# Patient Record
Sex: Male | Born: 1940 | Race: White | Hispanic: No | Smoking: Former smoker
Health system: Southern US, Community
[De-identification: ages and names within clinical notes are randomized; demographics above are authoritative.]

---

## 2016-10-23 DIAGNOSIS — F17210 Nicotine dependence, cigarettes, uncomplicated: Secondary | ICD-10-CM | POA: Diagnosis not present

## 2016-10-23 DIAGNOSIS — H53149 Visual discomfort, unspecified: Secondary | ICD-10-CM | POA: Diagnosis not present

## 2016-10-23 DIAGNOSIS — Z880 Allergy status to penicillin: Secondary | ICD-10-CM | POA: Diagnosis not present

## 2016-10-23 DIAGNOSIS — R51 Headache: Secondary | ICD-10-CM | POA: Diagnosis not present

## 2016-10-23 DIAGNOSIS — M542 Cervicalgia: Secondary | ICD-10-CM | POA: Diagnosis not present

## 2016-10-23 DIAGNOSIS — J0100 Acute maxillary sinusitis, unspecified: Secondary | ICD-10-CM | POA: Diagnosis not present

## 2016-10-23 DIAGNOSIS — R5383 Other fatigue: Secondary | ICD-10-CM | POA: Diagnosis not present

## 2016-11-01 DIAGNOSIS — B9689 Other specified bacterial agents as the cause of diseases classified elsewhere: Secondary | ICD-10-CM | POA: Diagnosis not present

## 2016-11-01 DIAGNOSIS — J329 Chronic sinusitis, unspecified: Secondary | ICD-10-CM | POA: Diagnosis not present

## 2016-11-01 DIAGNOSIS — R21 Rash and other nonspecific skin eruption: Secondary | ICD-10-CM | POA: Diagnosis not present

## 2016-11-01 DIAGNOSIS — R51 Headache: Secondary | ICD-10-CM | POA: Diagnosis not present

## 2016-12-01 DIAGNOSIS — R030 Elevated blood-pressure reading, without diagnosis of hypertension: Secondary | ICD-10-CM | POA: Diagnosis not present

## 2016-12-01 DIAGNOSIS — R51 Headache: Secondary | ICD-10-CM | POA: Diagnosis not present

## 2016-12-01 DIAGNOSIS — R21 Rash and other nonspecific skin eruption: Secondary | ICD-10-CM | POA: Diagnosis not present

## 2016-12-01 DIAGNOSIS — E0590 Thyrotoxicosis, unspecified without thyrotoxic crisis or storm: Secondary | ICD-10-CM | POA: Diagnosis not present

## 2016-12-15 DIAGNOSIS — Z125 Encounter for screening for malignant neoplasm of prostate: Secondary | ICD-10-CM | POA: Diagnosis not present

## 2016-12-15 DIAGNOSIS — N529 Male erectile dysfunction, unspecified: Secondary | ICD-10-CM | POA: Diagnosis not present

## 2016-12-15 DIAGNOSIS — Z716 Tobacco abuse counseling: Secondary | ICD-10-CM | POA: Diagnosis not present

## 2016-12-15 DIAGNOSIS — R0602 Shortness of breath: Secondary | ICD-10-CM | POA: Diagnosis not present

## 2016-12-15 DIAGNOSIS — R030 Elevated blood-pressure reading, without diagnosis of hypertension: Secondary | ICD-10-CM | POA: Diagnosis not present

## 2016-12-15 DIAGNOSIS — Z0000 Encounter for general adult medical examination without abnormal findings: Secondary | ICD-10-CM | POA: Diagnosis not present

## 2016-12-15 DIAGNOSIS — Z79899 Other long term (current) drug therapy: Secondary | ICD-10-CM | POA: Diagnosis not present

## 2017-01-20 DIAGNOSIS — I8391 Asymptomatic varicose veins of right lower extremity: Secondary | ICD-10-CM | POA: Diagnosis not present

## 2017-01-20 DIAGNOSIS — L300 Nummular dermatitis: Secondary | ICD-10-CM | POA: Diagnosis not present

## 2017-02-03 DIAGNOSIS — L300 Nummular dermatitis: Secondary | ICD-10-CM | POA: Diagnosis not present

## 2017-02-03 DIAGNOSIS — L820 Inflamed seborrheic keratosis: Secondary | ICD-10-CM | POA: Diagnosis not present

## 2017-02-17 DIAGNOSIS — R0602 Shortness of breath: Secondary | ICD-10-CM | POA: Diagnosis not present

## 2017-02-17 DIAGNOSIS — F17210 Nicotine dependence, cigarettes, uncomplicated: Secondary | ICD-10-CM | POA: Diagnosis not present

## 2017-02-17 DIAGNOSIS — Z87891 Personal history of nicotine dependence: Secondary | ICD-10-CM | POA: Diagnosis not present

## 2017-02-17 DIAGNOSIS — R942 Abnormal results of pulmonary function studies: Secondary | ICD-10-CM | POA: Diagnosis not present

## 2017-03-10 DIAGNOSIS — L300 Nummular dermatitis: Secondary | ICD-10-CM | POA: Diagnosis not present

## 2017-03-10 DIAGNOSIS — L820 Inflamed seborrheic keratosis: Secondary | ICD-10-CM | POA: Diagnosis not present

## 2017-03-10 DIAGNOSIS — L821 Other seborrheic keratosis: Secondary | ICD-10-CM | POA: Diagnosis not present

## 2017-04-05 DIAGNOSIS — E038 Other specified hypothyroidism: Secondary | ICD-10-CM | POA: Diagnosis not present

## 2017-04-05 DIAGNOSIS — Z79899 Other long term (current) drug therapy: Secondary | ICD-10-CM | POA: Diagnosis not present

## 2017-04-07 DIAGNOSIS — M7130 Other bursal cyst, unspecified site: Secondary | ICD-10-CM | POA: Diagnosis not present

## 2017-04-07 DIAGNOSIS — L821 Other seborrheic keratosis: Secondary | ICD-10-CM | POA: Diagnosis not present

## 2017-04-07 DIAGNOSIS — L820 Inflamed seborrheic keratosis: Secondary | ICD-10-CM | POA: Diagnosis not present

## 2017-04-07 DIAGNOSIS — D489 Neoplasm of uncertain behavior, unspecified: Secondary | ICD-10-CM | POA: Diagnosis not present

## 2017-04-15 DIAGNOSIS — D3610 Benign neoplasm of peripheral nerves and autonomic nervous system, unspecified: Secondary | ICD-10-CM | POA: Diagnosis not present

## 2017-07-16 DIAGNOSIS — R21 Rash and other nonspecific skin eruption: Secondary | ICD-10-CM | POA: Diagnosis not present

## 2017-07-16 DIAGNOSIS — W57XXXA Bitten or stung by nonvenomous insect and other nonvenomous arthropods, initial encounter: Secondary | ICD-10-CM | POA: Diagnosis not present

## 2017-07-22 DIAGNOSIS — B359 Dermatophytosis, unspecified: Secondary | ICD-10-CM | POA: Diagnosis not present

## 2017-07-22 DIAGNOSIS — R030 Elevated blood-pressure reading, without diagnosis of hypertension: Secondary | ICD-10-CM | POA: Diagnosis not present

## 2017-07-22 DIAGNOSIS — R21 Rash and other nonspecific skin eruption: Secondary | ICD-10-CM | POA: Diagnosis not present

## 2017-07-29 DIAGNOSIS — L821 Other seborrheic keratosis: Secondary | ICD-10-CM | POA: Diagnosis not present

## 2017-07-29 DIAGNOSIS — B359 Dermatophytosis, unspecified: Secondary | ICD-10-CM | POA: Diagnosis not present

## 2017-07-29 DIAGNOSIS — D3610 Benign neoplasm of peripheral nerves and autonomic nervous system, unspecified: Secondary | ICD-10-CM | POA: Diagnosis not present

## 2017-08-17 DIAGNOSIS — L299 Pruritus, unspecified: Secondary | ICD-10-CM | POA: Diagnosis not present

## 2017-08-17 DIAGNOSIS — L309 Dermatitis, unspecified: Secondary | ICD-10-CM | POA: Diagnosis not present

## 2017-08-19 DIAGNOSIS — E559 Vitamin D deficiency, unspecified: Secondary | ICD-10-CM | POA: Diagnosis not present

## 2017-08-19 DIAGNOSIS — Z79899 Other long term (current) drug therapy: Secondary | ICD-10-CM | POA: Diagnosis not present

## 2017-08-19 DIAGNOSIS — E038 Other specified hypothyroidism: Secondary | ICD-10-CM | POA: Diagnosis not present

## 2017-08-19 DIAGNOSIS — E7800 Pure hypercholesterolemia, unspecified: Secondary | ICD-10-CM | POA: Diagnosis not present

## 2017-09-06 DIAGNOSIS — Z1211 Encounter for screening for malignant neoplasm of colon: Secondary | ICD-10-CM | POA: Diagnosis not present

## 2017-09-06 DIAGNOSIS — E039 Hypothyroidism, unspecified: Secondary | ICD-10-CM | POA: Diagnosis not present

## 2017-09-06 DIAGNOSIS — Z6826 Body mass index (BMI) 26.0-26.9, adult: Secondary | ICD-10-CM | POA: Diagnosis not present

## 2017-09-09 DIAGNOSIS — L309 Dermatitis, unspecified: Secondary | ICD-10-CM | POA: Diagnosis not present

## 2017-09-19 DIAGNOSIS — Z1212 Encounter for screening for malignant neoplasm of rectum: Secondary | ICD-10-CM | POA: Diagnosis not present

## 2017-09-19 DIAGNOSIS — Z1211 Encounter for screening for malignant neoplasm of colon: Secondary | ICD-10-CM | POA: Diagnosis not present

## 2017-12-20 DIAGNOSIS — E559 Vitamin D deficiency, unspecified: Secondary | ICD-10-CM | POA: Diagnosis not present

## 2017-12-20 DIAGNOSIS — Z0000 Encounter for general adult medical examination without abnormal findings: Secondary | ICD-10-CM | POA: Diagnosis not present

## 2017-12-20 DIAGNOSIS — Z125 Encounter for screening for malignant neoplasm of prostate: Secondary | ICD-10-CM | POA: Diagnosis not present

## 2017-12-20 DIAGNOSIS — Z131 Encounter for screening for diabetes mellitus: Secondary | ICD-10-CM | POA: Diagnosis not present

## 2017-12-20 DIAGNOSIS — R5383 Other fatigue: Secondary | ICD-10-CM | POA: Diagnosis not present

## 2017-12-20 DIAGNOSIS — Z13220 Encounter for screening for lipoid disorders: Secondary | ICD-10-CM | POA: Diagnosis not present

## 2017-12-20 DIAGNOSIS — Z114 Encounter for screening for human immunodeficiency virus [HIV]: Secondary | ICD-10-CM | POA: Diagnosis not present

## 2017-12-20 DIAGNOSIS — E291 Testicular hypofunction: Secondary | ICD-10-CM | POA: Diagnosis not present

## 2017-12-20 DIAGNOSIS — E538 Deficiency of other specified B group vitamins: Secondary | ICD-10-CM | POA: Diagnosis not present

## 2017-12-20 DIAGNOSIS — R0602 Shortness of breath: Secondary | ICD-10-CM | POA: Diagnosis not present

## 2018-06-04 DIAGNOSIS — R1084 Generalized abdominal pain: Secondary | ICD-10-CM | POA: Diagnosis not present

## 2018-06-04 DIAGNOSIS — B999 Unspecified infectious disease: Secondary | ICD-10-CM | POA: Diagnosis not present

## 2018-06-04 DIAGNOSIS — R1011 Right upper quadrant pain: Secondary | ICD-10-CM | POA: Diagnosis not present

## 2018-06-06 DIAGNOSIS — R1011 Right upper quadrant pain: Secondary | ICD-10-CM | POA: Diagnosis not present

## 2018-06-21 DIAGNOSIS — R5383 Other fatigue: Secondary | ICD-10-CM | POA: Diagnosis not present

## 2018-06-21 DIAGNOSIS — E7800 Pure hypercholesterolemia, unspecified: Secondary | ICD-10-CM | POA: Diagnosis not present

## 2018-06-21 DIAGNOSIS — E291 Testicular hypofunction: Secondary | ICD-10-CM | POA: Diagnosis not present

## 2018-06-21 DIAGNOSIS — Z79899 Other long term (current) drug therapy: Secondary | ICD-10-CM | POA: Diagnosis not present

## 2018-06-21 DIAGNOSIS — E538 Deficiency of other specified B group vitamins: Secondary | ICD-10-CM | POA: Diagnosis not present

## 2018-06-21 DIAGNOSIS — E559 Vitamin D deficiency, unspecified: Secondary | ICD-10-CM | POA: Diagnosis not present

## 2018-08-15 DIAGNOSIS — R05 Cough: Secondary | ICD-10-CM | POA: Diagnosis not present

## 2018-08-15 DIAGNOSIS — R531 Weakness: Secondary | ICD-10-CM | POA: Diagnosis not present

## 2018-08-15 DIAGNOSIS — R0981 Nasal congestion: Secondary | ICD-10-CM | POA: Diagnosis not present

## 2018-08-15 DIAGNOSIS — R9431 Abnormal electrocardiogram [ECG] [EKG]: Secondary | ICD-10-CM | POA: Diagnosis not present

## 2018-08-15 DIAGNOSIS — R0602 Shortness of breath: Secondary | ICD-10-CM | POA: Diagnosis not present

## 2018-08-15 DIAGNOSIS — R410 Disorientation, unspecified: Secondary | ICD-10-CM | POA: Diagnosis not present

## 2018-08-15 DIAGNOSIS — Z87891 Personal history of nicotine dependence: Secondary | ICD-10-CM | POA: Diagnosis not present

## 2018-08-15 DIAGNOSIS — J441 Chronic obstructive pulmonary disease with (acute) exacerbation: Secondary | ICD-10-CM | POA: Diagnosis not present

## 2018-08-15 DIAGNOSIS — R918 Other nonspecific abnormal finding of lung field: Secondary | ICD-10-CM | POA: Diagnosis not present

## 2018-08-16 DIAGNOSIS — R9431 Abnormal electrocardiogram [ECG] [EKG]: Secondary | ICD-10-CM | POA: Diagnosis not present

## 2018-09-22 ENCOUNTER — Ambulatory Visit (INDEPENDENT_AMBULATORY_CARE_PROVIDER_SITE_OTHER): Payer: PPO | Admitting: Family Medicine

## 2018-09-22 ENCOUNTER — Encounter: Payer: Self-pay | Admitting: Family Medicine

## 2018-09-22 ENCOUNTER — Other Ambulatory Visit: Payer: Self-pay

## 2018-09-22 VITALS — BP 130/62 | HR 69 | Temp 97.7°F | Ht 69.0 in | Wt 162.0 lb

## 2018-09-22 DIAGNOSIS — E039 Hypothyroidism, unspecified: Secondary | ICD-10-CM | POA: Diagnosis not present

## 2018-09-22 DIAGNOSIS — R479 Unspecified speech disturbances: Secondary | ICD-10-CM | POA: Diagnosis not present

## 2018-09-22 DIAGNOSIS — Z122 Encounter for screening for malignant neoplasm of respiratory organs: Secondary | ICD-10-CM

## 2018-09-22 DIAGNOSIS — R499 Unspecified voice and resonance disorder: Secondary | ICD-10-CM

## 2018-09-22 DIAGNOSIS — R0609 Other forms of dyspnea: Secondary | ICD-10-CM | POA: Diagnosis not present

## 2018-09-22 DIAGNOSIS — J449 Chronic obstructive pulmonary disease, unspecified: Secondary | ICD-10-CM | POA: Diagnosis not present

## 2018-09-22 MED ORDER — ALBUTEROL SULFATE HFA 108 (90 BASE) MCG/ACT IN AERS: 2.0000 | INHALATION_SPRAY | RESPIRATORY_TRACT | 5 refills | Status: AC | PRN

## 2018-09-22 NOTE — Progress Notes (Signed)
[facility] Phone: [phone]     Scott Luna - 78 y.o. male MRN [id number]  Date of birth: 1940/11/18  Subjective:   cc: new patient, voice problem  HPI:    Voice problem - new issue.  Having trouble talking, but states today is a 'good day'.  He feels as if he doesn't 'have enough air' to anunciate.  No throat pain.  It started three weeks ago. No trigger or inciting incident.  He has a chronic cough from smoking. He has daily phlegm.  No wheezing.  He started smoking at age 64 and quit two years ago.  He smoked 1.5-2 ppd.  He went to a pulmonologist on [place] 3 to 6 months ago and was given an inhaler.  They took chest x rays but he hasn't been able to get his records from them bc the medical records department is 'shut down'.   The medical records department is 'shut down'. His son, an EMT, also gave him a small inhaler.  Uncertain of the type of inhaler, whether it is every day or prn.  He uses it once a week.  If he walks for a long period of time his legs feel weak. No dififculty swallowing, just 'soft and scratchy' voice.    He was in the hospital 6 months ago and then 3 months ago for SOB, and  'trouble getting enough air'    Hypothyroid: He takes medication for thyroid on a daily basis.  He had radiation therapy 20 years ago for hyperthyroid, now takes 59mcg of synthroid.    Not taking any other medications.  Takes vitamin B3 and B12.    No allergies.    SH: minmial alcohol use 'beer every now and then'. No drug use.  Widowed 9 years ago.  Boy and girl.  Working at [employer].     PMH: no family hx of throat disease.  No family or personal hx of heart diseae.   ROS: See HPI for pertinent positives and negatives  Past Medical History  Family history reviewed for today's visit. No changes.  Social history- patient is a former smoker  Objective:   BP 130/62   Pulse 69   Temp 97.7 F (36.5 C) (Oral)   Ht 5\' 9"  (1.753 m)   Wt 162 lb  (73.5 kg)   SpO2 96%   BMI 23.92 kg/m  Gen: NAD, alert and oriented, cooperative with exam HEENT: NCAT, EOMI, MMM Neck: FROM, supple, no masses CV: normal rate, regular rhythm. No murmurs, no rubs.  Resp: LCTAB, no wheezes, crackles. normal work of breathing GI: nontender to palpation, BS present, no guarding or organomegaly Msk: No edema, warm, normal tone, moves UE/LE spontaneously Neuro: CN II-XII grossly intact. no gross deficits Skin: No rashes, no lesions Psych: Appropriate behavior  Assessment/Plan:   No problem-specific Assessment & Plan notes found for this encounter.   Orders Placed This Encounter  Procedures  . CT CHEST LUNG CA SCREEN LOW DOSE W/O CM    Standing Status:   Future    Standing Expiration Date:   11/22/2019    Order Specific Question:   Reason for Exam (SYMPTOM  OR DIAGNOSIS REQUIRED)    Answer:   lung cancer screen. former smoker. 30+ pack years    Order Specific Question:   Preferred Imaging Location?    Answer:   [facility]    Order Specific Question:   Radiology Contrast Protocol - do NOT remove  file path    Answer:   [email or web address]  . CBC  . Basic Metabolic Panel  . TSH  . Lipid Panel  . Ambulatory referral to ENT    Referral Priority:   Routine    Referral Type:   Consultation    Referral Reason:   Specialty Services Required    Requested Specialty:   Otolaryngology    Number of Visits Requested:   1    Meds ordered this encounter  Medications  . albuterol (VENTOLIN HFA) 108 (90 Base) MCG/ACT inhaler    Sig: Inhale 2 puffs into the lungs every 4 (four) hours as needed for wheezing or shortness of breath.    Dispense:  18 g    Refill:  Aguilar, MD PGY-2 [facility]

## 2018-09-22 NOTE — Patient Instructions (Addendum)
I have prescribed you an albuterol rescue inhaler which you can use as needed for shortness of breath or wheezing.  I will let you know the results of the lab work when I get it.  I have put in a referral for ear nose and throat, this will take a while to get seen by them.  It is important you get the CT scan before you see them.  Have a great day,  Clemetine Marker, MD

## 2018-09-23 LAB — BASIC METABOLIC PANEL
BUN/Creatinine Ratio: 16 (ref 10–24)
BUN: 19 mg/dL (ref 8–27)
CO2: 22 mmol/L (ref 20–29)
Calcium: 9.5 mg/dL (ref 8.6–10.2)
Chloride: 102 mmol/L (ref 96–106)
Creatinine, Ser: 1.19 mg/dL (ref 0.76–1.27)
GFR calc Af Amer: 68 mL/min/{1.73_m2} (ref >59)
GFR calc non Af Amer: 59 mL/min/{1.73_m2} — ABNORMAL LOW (ref >59)
Glucose: 83 mg/dL (ref 65–99)
Potassium: 4.6 mmol/L (ref 3.5–5.2)
Sodium: 139 mmol/L (ref 134–144)

## 2018-09-23 LAB — CBC
Hematocrit: 42.9 % (ref 37.5–51.0)
Hemoglobin: 14.2 g/dL (ref 13.0–17.7)
MCH: 30.6 pg (ref 26.6–33.0)
MCHC: 33.1 g/dL (ref 31.5–35.7)
MCV: 93 fL (ref 79–97)
Platelets: 298 10*3/uL (ref 150–450)
RBC: 4.64 x10E6/uL (ref 4.14–5.80)
RDW: 13.1 % (ref 11.6–15.4)
WBC: 5.4 10*3/uL (ref 3.4–10.8)

## 2018-09-23 LAB — LIPID PANEL
Chol/HDL Ratio: 4.4 ratio (ref 0.0–5.0)
Cholesterol, Total: 214 mg/dL — ABNORMAL HIGH (ref 100–199)
HDL: 49 mg/dL (ref >39)
LDL Chol Calc (NIH): 142 mg/dL — ABNORMAL HIGH (ref 0–99)
Triglycerides: 126 mg/dL (ref 0–149)
VLDL Cholesterol Cal: 23 mg/dL (ref 5–40)

## 2018-09-23 LAB — TSH: TSH: 2.05 u[IU]/mL (ref 0.450–4.500)

## 2018-09-26 DIAGNOSIS — E039 Hypothyroidism, unspecified: Secondary | ICD-10-CM | POA: Insufficient documentation

## 2018-09-26 DIAGNOSIS — R499 Unspecified voice and resonance disorder: Secondary | ICD-10-CM | POA: Insufficient documentation

## 2018-09-26 DIAGNOSIS — J449 Chronic obstructive pulmonary disease, unspecified: Secondary | ICD-10-CM | POA: Insufficient documentation

## 2018-09-26 NOTE — Assessment & Plan Note (Signed)
Complaint of not having the air capacity to speak like he normally does. Has been going on for three weeks. Long time former smoker with over 50 pack year history.  Also having decreased exercise capacity.  He does not believe his voice problem is cause by his lungs, but is more 'in his throat'.  On exam he has crackles on the right side. Oropharynx appears normal.  Voice does not appear soft or hoarse, though he said today was an improvement from other days.   - BMP, CBC,  - lung cancer screening CT - referral to ENT

## 2018-09-26 NOTE — Assessment & Plan Note (Addendum)
Chronic condition.  Currently on 88 mcg synthroid.  No refill needed today.   - TSH

## 2018-09-26 NOTE — Assessment & Plan Note (Signed)
Has recently seen two pulmonologists (did not get along with first one).  States he had PFTs performed and was given an inhaler.  Do not have those results and uncertain what inhaler he has, though likely albuterol.   - albuterol inhaler - request records from pulmonologist.

## 2018-09-27 ENCOUNTER — Ambulatory Visit (HOSPITAL_BASED_OUTPATIENT_CLINIC_OR_DEPARTMENT_OTHER)
Admission: RE | Admit: 2018-09-27 | Discharge: 2018-09-27 | Disposition: A | Discharge status: Home / Self Care | Payer: PPO | Source: Ambulatory Visit | Attending: Family Medicine | Admitting: Family Medicine

## 2018-09-27 ENCOUNTER — Other Ambulatory Visit: Payer: Self-pay

## 2018-09-27 DIAGNOSIS — Z87891 Personal history of nicotine dependence: Secondary | ICD-10-CM | POA: Diagnosis not present

## 2018-09-27 DIAGNOSIS — J439 Emphysema, unspecified: Secondary | ICD-10-CM | POA: Diagnosis not present

## 2018-09-27 DIAGNOSIS — Z122 Encounter for screening for malignant neoplasm of respiratory organs: Secondary | ICD-10-CM | POA: Insufficient documentation

## 2018-09-28 ENCOUNTER — Encounter: Payer: Self-pay | Admitting: Family Medicine

## 2018-10-20 ENCOUNTER — Other Ambulatory Visit: Payer: Self-pay

## 2018-10-20 ENCOUNTER — Ambulatory Visit (INDEPENDENT_AMBULATORY_CARE_PROVIDER_SITE_OTHER): Payer: PPO | Admitting: Family Medicine

## 2018-10-20 ENCOUNTER — Encounter: Payer: Self-pay | Admitting: Family Medicine

## 2018-10-20 VITALS — BP 104/60 | HR 65 | Wt 165.2 lb

## 2018-10-20 DIAGNOSIS — J449 Chronic obstructive pulmonary disease, unspecified: Secondary | ICD-10-CM

## 2018-10-20 DIAGNOSIS — R413 Other amnesia: Secondary | ICD-10-CM

## 2018-10-20 NOTE — Patient Instructions (Signed)
I would like to see you back in 2-3 weeks for further evaluation of your memory concerns.  I will discuss with you your results from the labs we ordered at that time.    I will get the records from the pulmonologist too, which we can discuss at that appointment.    Have a great day,   Clemetine Marker, MD.

## 2018-10-20 NOTE — Assessment & Plan Note (Signed)
Patient notes improvement with dyspnea since previous visit. Received prescription for albuterol inhaler on 9/10 but has not used medication. Chest CT on 9/15 revealed a benign nodule and moderate emphysematous changes. States that he was recently seen by a pulmonologist and had PFTs performed.  - Will request records from pulmonologist and review with patient at next visit in 2-3 weeks

## 2018-10-20 NOTE — Assessment & Plan Note (Addendum)
Patient reports difficulty focusing and memory changes for the past 5 weeks. Past medical history significant for hypothyroidism. PE unremarkable, PHQ score: 5. Mild cognitive impairment is a concern given age and presentation. Dementia is less likely given patient is able to perform activities of daily living. Pseudodementia (secondary to depression) also considered but less likely given normal PHQ-9. Hypothyroidism and B12 deficiency could also contribute to memory changes and must be ruled out.  - Patient advised to return in 2-3 weeks to receive MOCA and further evaluate of mild cognitive impairment/dementia.  - Thyroid function test and B12 labs ordered. Will review findings.

## 2018-10-20 NOTE — Progress Notes (Signed)
Subjective  CC: Memory changes and COPD  Scott Luna is a 78 y.o. male with a past medical history of hypothyroidism and COPD who presents today with the following problems:  MEMORY CHANGES:  Patient states that his brain feels "slower" and he has had difficulty focusing and remembering words for the past 5 weeks. He also notes changes in his handwriting, which he says is smaller than normal. Reports several episodes of forgetfulness while performing daily activities but is able to refocus himself eventually. He states that symptoms have gradually improved on their own since onset. Denies headaches, muscle spasms, vision changes, night sweats, loss of coordination/balance. No syncopal episodes. He has noted a 10 lb weight loss over the past 4 months, but states that this is likely associated with a change in his diet.   COPD Patient was last seen on 09/22/18 for a voice problem, which he felt was related to being short of breath. He reports improvement with dyspnea since that time and is able to walk and carry out daily functions without feeling significantly short of breath. Patient still reports issues with speaking but now believes this is associated with memory issues. Patient received chest CT on 9/15 that revealed a benign nodule and moderate emphysematous changes but was otherwise normal. He was prescribed an albuterol inhaler on 9/10 but has not picked up the medication. States that he does not think he needs the inhaler. He states that he was recently seen by a pulmonologist [place], who performed PFTs and prescribed patient with an inhaler. Discussed receiving records from the pulmonologist.  Reports congestion and chronic cough (productive of yellow sputum) since he quit smoking 2 years ago. Denies chest pain, blood in sputum.   Pertinent P/F/SHx:  - Hypothyroidism treated with Synthroid - COPD - Former smoker: began at age of 12 (1.5-2 ppd), quit 2 years ago   ROS: Pertinent  ROS included in HPI. Objective  Physical Exam:  BP 104/60   Pulse 65   Wt 165 lb 3.2 oz (74.9 kg)   SpO2 97%   BMI 24.40 kg/m   Gen: NAD, resting comfortably CV: RRR with no murmurs appreciated Pulm: NWOB, mild right-sided wheezing  MSK: no edema, cyanosis, or clubbing noted Skin: warm, dry Neuro: grossly normal, CN II-VII intact, negative romberg, normal gait, negative finger to nose, moves all extremities Psych: Normal affect and thought content  Depression screen [facility] 2/9 10/20/2018  Decreased Interest 1  Down, Depressed, Hopeless 0  PHQ - 2 Score 1  Altered sleeping 1  Tired, decreased energy 1  Change in appetite 0  Feeling bad or failure about yourself  0  Trouble concentrating 2  Moving slowly or fidgety/restless 0  Suicidal thoughts 0  PHQ-9 Score 5  Difficult doing work/chores Not difficult at all    Pertinent Labs:   Assessment & Plan    Problem List Items Addressed This Visit      Respiratory   COPD (chronic obstructive pulmonary disease) ([place])    Patient notes improvement with dyspnea since previous visit. Received prescription for albuterol inhaler on 9/10 but has not used medication. Chest CT on 9/15 revealed a benign nodule and moderate emphysematous changes. States that he was recently seen by a pulmonologist and had PFTs performed.  - Will request records from pulmonologist and review with patient at next visit in 2-3 weeks        Other   Memory changes    Patient reports difficulty focusing and memory changes  for the past 5 weeks. Past medical history significant for hypothyroidism. PE unremarkable, PHQ score: 5. Mild cognitive impairment is a concern given age and presentation. Dementia is less likely given patient is able to perform activities of daily living. Pseudodementia (secondary to depression) also considered but less likely given normal PHQ-9. Hypothyroidism and B12 deficiency could also contribute to memory changes and must be ruled out.  -  Patient advised to return in 2-3 weeks to receive MOCA and further evaluate of mild cognitive impairment/dementia.  - Thyroid function test and B12 labs ordered. Will review findings.         Other Visit Diagnoses    Memory loss    -  Primary   Relevant Orders   TSH + free T4   Vitamin B12     Health Maintenance Due  Topic Date Due  . TETANUS/TDAP  05/22/1961  . PNA vac Low Risk Adult (1 of 2 - PCV13) 05/23/2007    Follow-up: Return visit in 3 weeks.   Scott Luna, Medical Student

## 2018-10-21 LAB — VITAMIN B12: Vitamin B-12: 1904 pg/mL — ABNORMAL HIGH (ref 232–1245)

## 2018-10-21 LAB — TSH+FREE T4
Free T4: 1.4 ng/dL (ref 0.82–1.77)
TSH: 1.07 u[IU]/mL (ref 0.450–4.500)

## 2018-12-27 DIAGNOSIS — Z79899 Other long term (current) drug therapy: Secondary | ICD-10-CM | POA: Diagnosis not present

## 2018-12-27 DIAGNOSIS — E559 Vitamin D deficiency, unspecified: Secondary | ICD-10-CM | POA: Diagnosis not present

## 2018-12-27 DIAGNOSIS — R0602 Shortness of breath: Secondary | ICD-10-CM | POA: Diagnosis not present

## 2018-12-27 DIAGNOSIS — Z0000 Encounter for general adult medical examination without abnormal findings: Secondary | ICD-10-CM | POA: Diagnosis not present

## 2018-12-27 DIAGNOSIS — Z13220 Encounter for screening for lipoid disorders: Secondary | ICD-10-CM | POA: Diagnosis not present

## 2018-12-27 DIAGNOSIS — E039 Hypothyroidism, unspecified: Secondary | ICD-10-CM | POA: Diagnosis not present

## 2018-12-27 DIAGNOSIS — E538 Deficiency of other specified B group vitamins: Secondary | ICD-10-CM | POA: Diagnosis not present

## 2018-12-27 DIAGNOSIS — Z125 Encounter for screening for malignant neoplasm of prostate: Secondary | ICD-10-CM | POA: Diagnosis not present

## 2018-12-27 DIAGNOSIS — Z1159 Encounter for screening for other viral diseases: Secondary | ICD-10-CM | POA: Diagnosis not present

## 2019-07-13 DIAGNOSIS — E538 Deficiency of other specified B group vitamins: Secondary | ICD-10-CM | POA: Diagnosis not present

## 2019-07-13 DIAGNOSIS — L309 Dermatitis, unspecified: Secondary | ICD-10-CM | POA: Diagnosis not present

## 2019-07-13 DIAGNOSIS — E039 Hypothyroidism, unspecified: Secondary | ICD-10-CM | POA: Diagnosis not present

## 2019-07-13 DIAGNOSIS — R413 Other amnesia: Secondary | ICD-10-CM | POA: Diagnosis not present

## 2019-07-13 DIAGNOSIS — Z1159 Encounter for screening for other viral diseases: Secondary | ICD-10-CM | POA: Diagnosis not present

## 2019-07-13 DIAGNOSIS — Z79899 Other long term (current) drug therapy: Secondary | ICD-10-CM | POA: Diagnosis not present

## 2019-07-13 DIAGNOSIS — E7800 Pure hypercholesterolemia, unspecified: Secondary | ICD-10-CM | POA: Diagnosis not present

## 2019-07-13 DIAGNOSIS — E559 Vitamin D deficiency, unspecified: Secondary | ICD-10-CM | POA: Diagnosis not present

## 2019-07-25 DIAGNOSIS — R42 Dizziness and giddiness: Secondary | ICD-10-CM | POA: Diagnosis not present

## 2019-08-04 DIAGNOSIS — R930 Abnormal findings on diagnostic imaging of skull and head, not elsewhere classified: Secondary | ICD-10-CM | POA: Diagnosis not present

## 2019-08-04 DIAGNOSIS — R413 Other amnesia: Secondary | ICD-10-CM | POA: Diagnosis not present

## 2019-08-14 DIAGNOSIS — R413 Other amnesia: Secondary | ICD-10-CM | POA: Diagnosis not present

## 2019-08-14 DIAGNOSIS — I639 Cerebral infarction, unspecified: Secondary | ICD-10-CM | POA: Diagnosis not present

## 2019-09-06 DIAGNOSIS — J3489 Other specified disorders of nose and nasal sinuses: Secondary | ICD-10-CM | POA: Diagnosis not present

## 2019-09-06 DIAGNOSIS — R413 Other amnesia: Secondary | ICD-10-CM | POA: Diagnosis not present

## 2019-09-06 DIAGNOSIS — I639 Cerebral infarction, unspecified: Secondary | ICD-10-CM | POA: Diagnosis not present

## 2019-09-06 DIAGNOSIS — I259 Chronic ischemic heart disease, unspecified: Secondary | ICD-10-CM | POA: Diagnosis not present

## 2019-09-06 DIAGNOSIS — I6389 Other cerebral infarction: Secondary | ICD-10-CM | POA: Diagnosis not present

## 2019-10-27 IMAGING — CT CT CHEST LUNG CANCER SCREENING LOW DOSE W/O CM
1 series · 10 of 10 positions shown, 13 images · non-contrast
Comparison: None.

CLINICAL DATA: 78-year-old male former smoker, with 75 pack-year
history of smoking, for initial lung cancer screening

EXAM:
CT CHEST WITHOUT CONTRAST LOW-DOSE FOR LUNG CANCER SCREENING
TECHNIQUE: Multidetector CT imaging of the chest was performed following the
standard protocol without IV contrast.

[ct lung segmentation data · axial · 0.69mm/px · z∈[-346,-346]mm · 10 of 319 frames shown]
[frame 1/319  mediastinal]
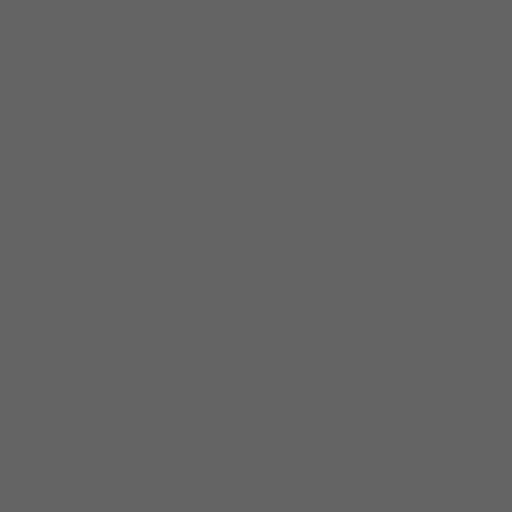
[frame 1/319  lung]
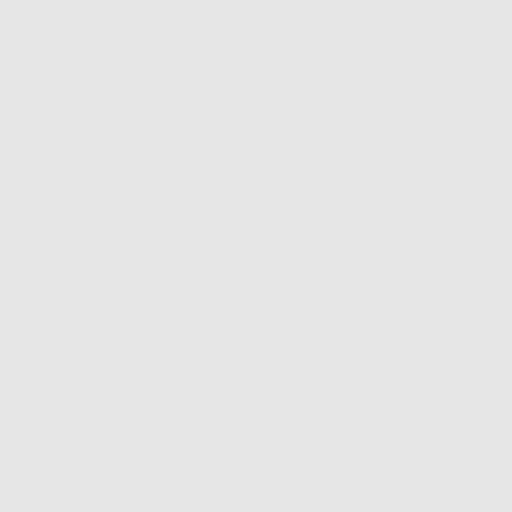
[frame 36/319  lung]
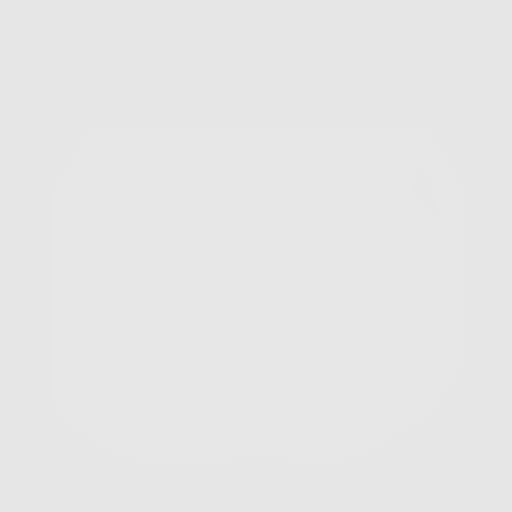
[frame 71/319  lung]
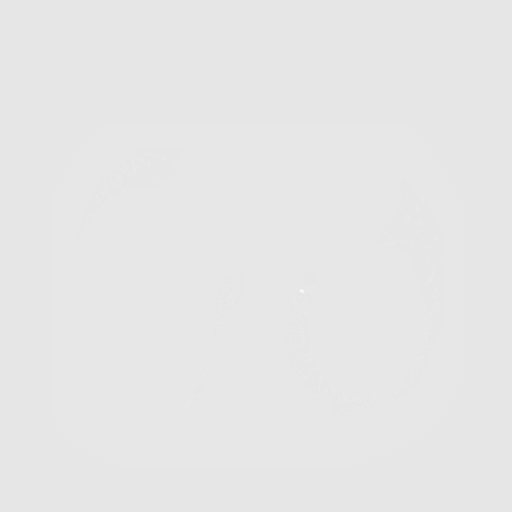
[frame 107/319  lung]
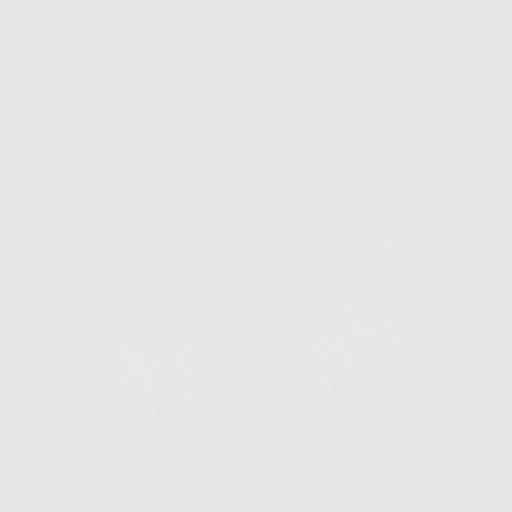
[frame 142/319  mediastinal]
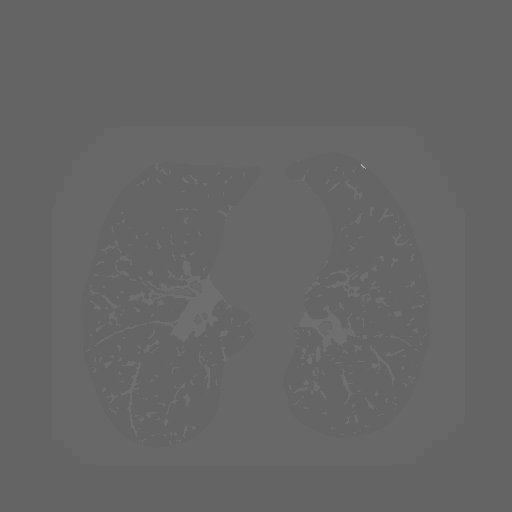
[frame 142/319  lung]
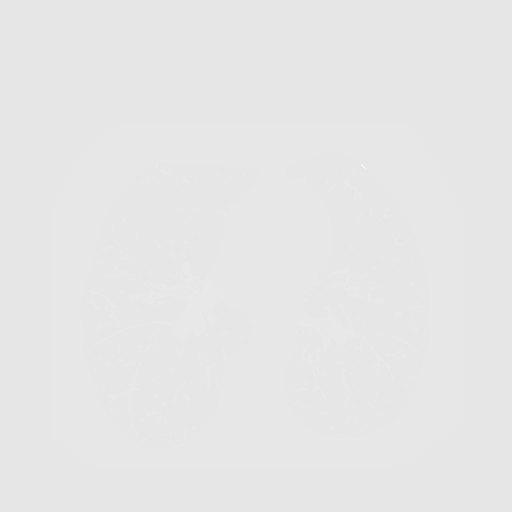
[frame 177/319  lung]
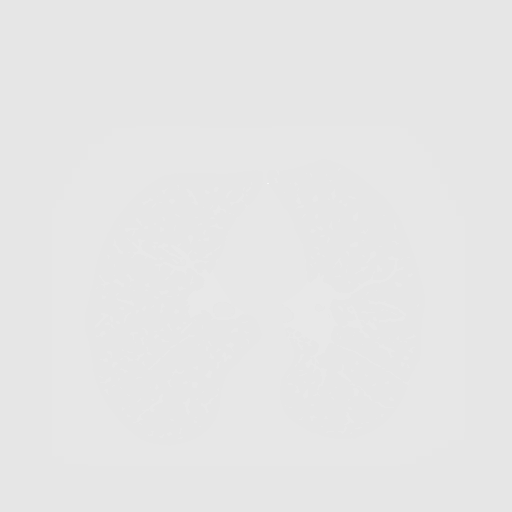
[frame 213/319  lung]
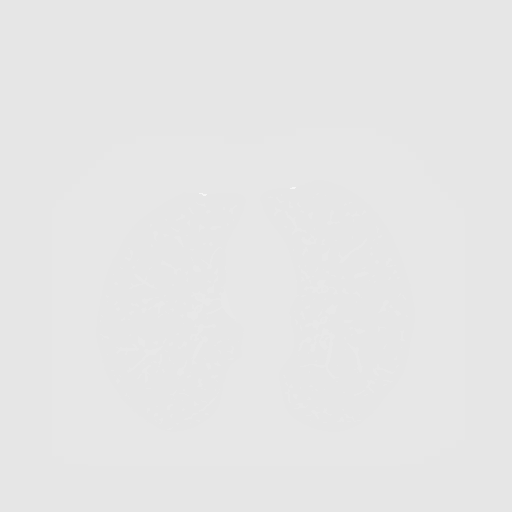
[frame 248/319  lung]
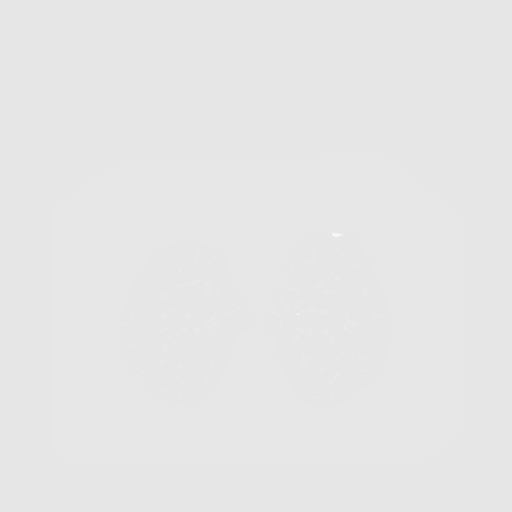
[frame 283/319  mediastinal]
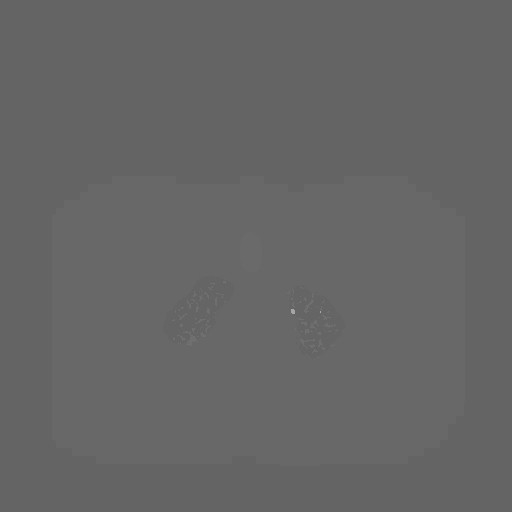
[frame 283/319  lung]
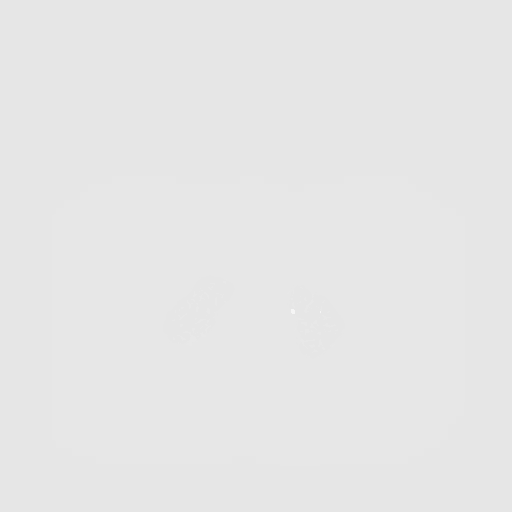
[frame 319/319  lung]
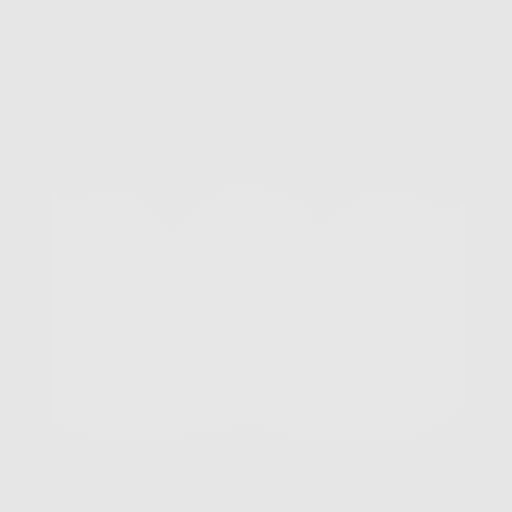

[10 of 10 positions shown; findings below may reference images not displayed]

FINDINGS: Cardiovascular: The heart is normal in size. No pericardial
effusion.

No evidence of thoracic aortic aneurysm. Atherosclerotic
calcifications of the aortic arch.

Mild coronary atherosclerosis the LAD and left circumflex.

Mediastinum/Nodes: Small mediastinal lymph nodes which do not meet
pathologic CT size criteria.

Visualized thyroid is unremarkable.

Lungs/Pleura: Biapical pleural-parenchymal scarring.

Moderate centrilobular and paraseptal emphysematous changes, upper
lung predominant.

No focal consolidation.

4.8 mm nodule in the medial left upper lobe (image 93).

No pleural effusion or pneumothorax.

Upper Abdomen: Visualized upper abdomen is grossly unremarkable,
noting vascular calcifications.

Musculoskeletal: Degenerative changes of the visualized
thoracolumbar spine.
IMPRESSION: Lung-RADS 2, benign appearance or behavior. Continue annual
screening with low-dose chest CT without contrast in 12 months.

Aortic Atherosclerosis ([id number]) and Emphysema ([id number].8).

## 2019-11-03 DIAGNOSIS — I083 Combined rheumatic disorders of mitral, aortic and tricuspid valves: Secondary | ICD-10-CM | POA: Diagnosis not present

## 2019-11-03 DIAGNOSIS — I6523 Occlusion and stenosis of bilateral carotid arteries: Secondary | ICD-10-CM | POA: Diagnosis not present

## 2019-11-03 DIAGNOSIS — I6389 Other cerebral infarction: Secondary | ICD-10-CM | POA: Diagnosis not present

## 2019-11-03 DIAGNOSIS — I669 Occlusion and stenosis of unspecified cerebral artery: Secondary | ICD-10-CM | POA: Diagnosis not present

## 2019-11-15 DIAGNOSIS — I6523 Occlusion and stenosis of bilateral carotid arteries: Secondary | ICD-10-CM | POA: Diagnosis not present

## 2019-11-23 DIAGNOSIS — R413 Other amnesia: Secondary | ICD-10-CM | POA: Diagnosis not present

## 2019-11-23 DIAGNOSIS — I6523 Occlusion and stenosis of bilateral carotid arteries: Secondary | ICD-10-CM | POA: Diagnosis not present

## 2019-11-23 DIAGNOSIS — I651 Occlusion and stenosis of basilar artery: Secondary | ICD-10-CM | POA: Diagnosis not present

## 2019-11-23 DIAGNOSIS — I672 Cerebral atherosclerosis: Secondary | ICD-10-CM | POA: Diagnosis not present

## 2020-02-06 DIAGNOSIS — R300 Dysuria: Secondary | ICD-10-CM | POA: Diagnosis not present

## 2020-02-06 DIAGNOSIS — Z1159 Encounter for screening for other viral diseases: Secondary | ICD-10-CM | POA: Diagnosis not present

## 2020-02-06 DIAGNOSIS — E538 Deficiency of other specified B group vitamins: Secondary | ICD-10-CM | POA: Diagnosis not present

## 2020-02-06 DIAGNOSIS — Z79899 Other long term (current) drug therapy: Secondary | ICD-10-CM | POA: Diagnosis not present

## 2020-02-06 DIAGNOSIS — Z0000 Encounter for general adult medical examination without abnormal findings: Secondary | ICD-10-CM | POA: Diagnosis not present

## 2020-02-06 DIAGNOSIS — E7800 Pure hypercholesterolemia, unspecified: Secondary | ICD-10-CM | POA: Diagnosis not present

## 2020-02-06 DIAGNOSIS — R0602 Shortness of breath: Secondary | ICD-10-CM | POA: Diagnosis not present

## 2020-02-06 DIAGNOSIS — E291 Testicular hypofunction: Secondary | ICD-10-CM | POA: Diagnosis not present

## 2020-02-06 DIAGNOSIS — E559 Vitamin D deficiency, unspecified: Secondary | ICD-10-CM | POA: Diagnosis not present

## 2020-02-06 DIAGNOSIS — Z125 Encounter for screening for malignant neoplasm of prostate: Secondary | ICD-10-CM | POA: Diagnosis not present

## 2020-02-06 DIAGNOSIS — R5383 Other fatigue: Secondary | ICD-10-CM | POA: Diagnosis not present

## 2020-08-06 DIAGNOSIS — E559 Vitamin D deficiency, unspecified: Secondary | ICD-10-CM | POA: Diagnosis not present

## 2020-08-06 DIAGNOSIS — Z79899 Other long term (current) drug therapy: Secondary | ICD-10-CM | POA: Diagnosis not present

## 2020-08-06 DIAGNOSIS — E538 Deficiency of other specified B group vitamins: Secondary | ICD-10-CM | POA: Diagnosis not present

## 2020-08-06 DIAGNOSIS — E039 Hypothyroidism, unspecified: Secondary | ICD-10-CM | POA: Diagnosis not present

## 2020-08-06 DIAGNOSIS — Z1159 Encounter for screening for other viral diseases: Secondary | ICD-10-CM | POA: Diagnosis not present

## 2020-08-06 DIAGNOSIS — E7800 Pure hypercholesterolemia, unspecified: Secondary | ICD-10-CM | POA: Diagnosis not present

## 2020-10-03 DIAGNOSIS — I6523 Occlusion and stenosis of bilateral carotid arteries: Secondary | ICD-10-CM | POA: Diagnosis not present

## 2021-06-17 ENCOUNTER — Encounter: Payer: Self-pay | Admitting: *Deleted
